# Patient Record
Sex: Male | Born: 1995
Health system: Southern US, Community
[De-identification: ages and names within clinical notes are randomized; demographics above are authoritative.]

## PROBLEM LIST (undated history)

## (undated) DIAGNOSIS — K921 Melena: Secondary | ICD-10-CM

## (undated) HISTORY — DX: Melena: K92.1

## (undated) HISTORY — PX: NO PAST SURGERIES: SHX2092

---
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
## 2018-02-27 ENCOUNTER — Encounter: Payer: Self-pay | Admitting: Family Medicine

## 2018-02-27 ENCOUNTER — Ambulatory Visit: Payer: 59 | Admitting: Family Medicine

## 2018-02-27 VITALS — BP 121/76 | HR 79 | Temp 98.6°F | Resp 20 | Ht 72.0 in | Wt 216.0 lb

## 2018-02-27 DIAGNOSIS — Z7689 Persons encountering health services in other specified circumstances: Secondary | ICD-10-CM | POA: Diagnosis not present

## 2018-02-27 DIAGNOSIS — W57XXXA Bitten or stung by nonvenomous insect and other nonvenomous arthropods, initial encounter: Secondary | ICD-10-CM | POA: Diagnosis not present

## 2018-02-27 DIAGNOSIS — S30861A Insect bite (nonvenomous) of abdominal wall, initial encounter: Secondary | ICD-10-CM | POA: Diagnosis not present

## 2018-02-27 DIAGNOSIS — E663 Overweight: Secondary | ICD-10-CM | POA: Insufficient documentation

## 2018-02-27 MED ORDER — DOXYCYCLINE HYCLATE 100 MG PO TABS: 200.0000 mg | ORAL_TABLET | Freq: Once | ORAL | 0 refills | Status: AC

## 2018-02-27 NOTE — Patient Instructions (Addendum)
Take 2 tabs of doxy today, with some food. This will prevent Lyme disease.  Steroid cream for comfort.  Tick Bite Information, Adult Ticks are insects that can bite. Most ticks live in shrubs and grassy areas. They climb onto people and animals that go by. Then they bite. Some ticks carry germs that can make you sick. How can I prevent tick bites?  Use an insect repellent that has 20% or higher of the ingredients DEET, picaridin, or IR3535. Put this insect repellent on: ? Bare skin. ? The tops of your boots. ? Your pant legs. ? The ends of your sleeves.  If you use an insect repellent that has the ingredient permethrin, make sure to follow the instructions on the bottle. Treat the following: ? Clothing. ? Supplies. ? Boots. ? Tents.  Wear long sleeves, long pants, and light colors.  Tuck your pant legs into your socks.  Stay in the middle of the trail.  Try not to walk through long grass.  Before going inside your house, check your clothes, hair, and skin for ticks. Make sure to check your head, neck, armpits, waist, groin, and joint areas.  Check for ticks every day.  When you come indoors: ? Wash your clothes right away. ? Shower right away. ? Dry your clothes in a dryer on high heat for 60 minutes or more. What is the right way to remove a tick? Remove a tick from your skin as soon as possible.  To remove a tick that is crawling on your skin: ? Go outdoors and brush the tick off. ? Use tape or a lint roller.  To remove a tick that is biting: ? Wash your hands. ? If you have latex gloves, put them on. ? Use tweezers, curved forceps, or a tick-removal tool to grasp the tick. Grasp the tick as close to your skin and as close to the tick's head as possible. ? Gently pull up until the tick lets go.  Try to keep the tick's head attached to its body.  Do not twist or jerk the tick.  Do not squeeze or crush the tick.  Do not try to remove a tick with heat, alcohol,  petroleum jelly, or fingernail polish. How should I get rid of a tick? Here are some ways to get rid of a tick that is alive:  Place the tick in rubbing alcohol.  Place the tick in a bag or container you can close tightly.  Wrap the tick tightly in tape.  Flush the tick down the toilet.  Contact a doctor if:  You have symptoms of a disease, such as: ? Pain in a muscle, joint, or bone. ? Trouble walking or moving your legs. ? Numbness in your legs. ? Inability to move (paralysis). ? A red rash that makes a circle (bull's-eye rash). ? Redness and swelling where the tick bit you. ? A fever. ? Throwing up (vomiting) over and over. ? Diarrhea. ? Weight loss. ? Tender and swollen lymph glands. ? Shortness of breath. ? Cough. ? Belly pain (abdominal pain). ? Headache. ? Being more tired than normal. ? A change in how alert (conscious) you are. ? Confusion. Get help right away if:  You cannot remove a tick.  A part of a tick breaks off and gets stuck in your skin.  You are feeling worse. Summary  Ticks may carry germs that can make you sick.  To prevent tick bites, wear long sleeves, long pants, and light  colors. Use insect repellent. Follow the instructions on the bottle.  If the tick is biting, do not try to remove it with heat, alcohol, petroleum jelly, or fingernail polish.  Use tweezers, curved forceps, or a tick-removal tool to grasp the tick. Gently pull up until the tick lets go. Do not twist or jerk the tick. Do not squeeze or crush the tick.  If you have symptoms, contact a doctor. This information is not intended to replace advice given to you by your health care provider. Make sure you discuss any questions you have with your health care provider. Document Released: 09/06/2009 Document Revised: 09/22/2016 Document Reviewed: 09/22/2016 Elsevier Interactive Patient Education  2018 [employer].

## 2018-02-27 NOTE — Progress Notes (Signed)
Patient ID: Zachary Morrow, male  DOB: 08-08-1995, 22 y.o.   MRN: [id number] Patient Care Team    Relationship Specialty Notifications Start End  Natalia Leatherwood, DO PCP - General Family Medicine  02/27/18     Chief Complaint  Patient presents with  . Establish Care  . Insect Bite    tick bite right forearm 3 days ago    Subjective:  Zachary Morrow is a 22 y.o.  male present for new patient establishment. All past medical history, surgical history, allergies, family history, immunizations, medications and social history were updated in the electronic medical record today. All recent labs, ED visits and hospitalizations within the last year were reviewed.  Tick bite: Pt reports he was exposed/bite by a tick 2-3 days ago. He has applied steroid cream to the area. He states it is mildly itchy. The reaction was significant and he feels he had a "bullseye" rash at location. He reports the steroid cream has helped some. He denies fever, chills, headache, myalgias or rash. He is worried about lyme disease exposure because his friend's mother has lyme disease.   Depression screen PHQ 2/9 02/27/2018  Decreased Interest 0  Down, Depressed, Hopeless 0  PHQ - 2 Score 0   No flowsheet data found.  Current Exercise Habits: The patient does not participate in regular exercise at present Exercise limited by: None identified No flowsheet data found.   There is no immunization history on file for this patient.  No exam data present  Past Medical History:  Diagnosis Date  . Blood in stool 2016   history   No Known Allergies Past Surgical History:  Procedure Laterality Date  . NO PAST SURGERIES     Family History  Problem Relation Age of Onset  . Breast cancer Mother   . Miscarriages / [place] Mother   . Heart disease Father   . Hyperlipidemia Father   . Heart disease Maternal Grandmother   . Hyperlipidemia Maternal Grandmother   . Hypertension Maternal Grandmother   .  Stroke Maternal Grandmother   . Heart disease Maternal Grandfather   . Hyperlipidemia Maternal Grandfather   . Hypertension Maternal Grandfather   . Diabetes Maternal Grandfather   . Hyperlipidemia Paternal Grandmother   . Hypertension Paternal Grandmother   . Hearing loss Paternal Grandmother   . Heart disease Paternal Grandmother   . Cancer Paternal Grandmother    Social History   Socioeconomic History  . Marital status: Single    Spouse name: Not on file  . Number of children: Not on file  . Years of education: Not on file  . Highest education level: Not on file  Occupational History  . Not on file  Social Needs  . Financial resource strain: Not on file  . Food insecurity:    Worry: Not on file    Inability: Not on file  . Transportation needs:    Medical: Not on file    Non-medical: Not on file  Tobacco Use  . Smoking status: Never Smoker  . Smokeless tobacco: Never Used  Substance and Sexual Activity  . Alcohol use: Yes  . Drug use: Never  . Sexual activity: Yes    Partners: Female  Lifestyle  . Physical activity:    Days per week: Not on file    Minutes per session: Not on file  . Stress: Not on file  Relationships  . Social connections:    Talks on phone: Not on file  Gets together: Not on file    Attends religious service: Not on file    Active member of club or organization: Not on file    Attends meetings of clubs or organizations: Not on file    Relationship status: Not on file  . Intimate partner violence:    Fear of current or ex partner: Not on file    Emotionally abused: Not on file    Physically abused: Not on file    Forced sexual activity: Not on file  Other Topics Concern  . Not on file  Social History Narrative   Marital status/children/pets: Single   Education/employment: HS diploma   Safety:      -smoke alarm in the home:Yes     - wears seatbelt: Yes      Allergies as of 02/27/2018   No Known Allergies     Medication List          Accurate as of 02/27/18  1:54 PM. Always use your most recent med list.          doxycycline 100 MG tablet Commonly known as:  VIBRA-TABS Take 2 tablets (200 mg total) by mouth once for 1 dose.       All past medical history, surgical history, allergies, family history, immunizations andmedications were updated in the EMR today and reviewed under the history and medication portions of their EMR.    No results found for this or any previous visit (from the past 2160 hour(s)).  ROS: 14 pt review of systems performed and negative (unless mentioned in an HPI)  Objective: BP 121/76 (BP Location: Left Arm, Patient Position: Sitting, Cuff Size: Large)   Pulse 79   Temp 98.6 F (37 C)   Resp 20   Ht 6' (1.829 m)   Wt 216 lb (98 kg)   SpO2 98%   BMI 29.29 kg/m  Gen: Afebrile. No acute distress. Nontoxic in appearance, well-developed, well-nourished,  Overweight, caucasian male.  HENT: AT. Normal. Bilateral TM visualized and normal in appearance, normal external auditory canal. MMM, no oral lesions, adequate dentition. Eyes:Pupils Equal Round Reactive to light, Extraocular movements intact,  Conjunctiva without redness, discharge or icterus. CV: RRR Abd: Soft. NTND. BS present Skin: No rashes, purpura or petechiae. Erythema/local reaction to tick bite.  Neuro/Msk: Normal gait. PERLA. EOMi. Alert. Oriented x3.    Assessment/plan: Zachary Morrow is a 22 y.o. male present for  Tick bite of abdomen, initial encounter - discussed tick prevention and testing for lyme etc - Doxy 200 mg once prescribed, within window. Monitor for signs/sympotms of lyme. Return if they develop/  - F/U PRN  Encourage him to schedule CPE ( at least every other year)  Greater than 20 minutes spent with patient, >50% of time spent face to face counseling    Note is dictated utilizing voice recognition software. Although note has been proof read prior to signing, occasional typographical errors still can be  missed. If any questions arise, please do not hesitate to call for verification.  Electronically signed by: Felix Pacini, DO [facility]

## 2018-02-28 ENCOUNTER — Telehealth: Payer: Self-pay | Admitting: Family Medicine

## 2018-02-28 NOTE — Telephone Encounter (Signed)
Copied from CRM [id number]. Topic: Quick Communication - See Telephone Encounter >> Feb 28, 2018 12:10 PM [name] wrote: CRM for notification. See Telephone encounter for: 02/28/18.  Patient said the Doxycycline is making him sick on his stomach. He took them yesterday when he got home and he got sick about 45 mins later.

## 2018-02-28 NOTE — Telephone Encounter (Signed)
Tried to contact patient unable to leave a message. 

## 2018-03-01 NOTE — Telephone Encounter (Signed)
Returning call, CB [phone]

## 2018-03-01 NOTE — Telephone Encounter (Signed)
Spoke with patient he just had one dose to take he was worried since he vomited 45 minutes later he might not gotten medication in his system. He has not had any further symptoms related to the tick bite. Instructed patient to follow up as needed.

## 2018-07-25 ENCOUNTER — Other Ambulatory Visit: Payer: Self-pay | Admitting: Family Medicine

## 2018-07-25 ENCOUNTER — Ambulatory Visit
Admission: RE | Admit: 2018-07-25 | Discharge: 2018-07-25 | Disposition: A | Discharge status: Home / Self Care | Payer: No Typology Code available for payment source | Source: Ambulatory Visit | Attending: Family Medicine | Admitting: Family Medicine

## 2018-07-25 DIAGNOSIS — M542 Cervicalgia: Secondary | ICD-10-CM

## 2018-10-08 ENCOUNTER — Ambulatory Visit (INDEPENDENT_AMBULATORY_CARE_PROVIDER_SITE_OTHER): Payer: 59 | Admitting: Family Medicine

## 2018-10-08 ENCOUNTER — Encounter: Payer: Self-pay | Admitting: Family Medicine

## 2018-10-08 ENCOUNTER — Other Ambulatory Visit: Payer: Self-pay

## 2018-10-08 DIAGNOSIS — R319 Hematuria, unspecified: Secondary | ICD-10-CM

## 2018-10-08 DIAGNOSIS — R109 Unspecified abdominal pain: Secondary | ICD-10-CM

## 2018-10-08 DIAGNOSIS — N410 Acute prostatitis: Secondary | ICD-10-CM

## 2018-10-08 MED ORDER — SULFAMETHOXAZOLE-TRIMETHOPRIM 800-160 MG PO TABS: 1.0000 | ORAL_TABLET | Freq: Two times a day (BID) | ORAL | 0 refills | Status: AC

## 2018-10-08 NOTE — Progress Notes (Signed)
Virtual Visit via Video Note  I connected with pt  on 10/08/18 at 11:30 AM EDT by a video enabled telemedicine application and verified that I am speaking with the correct person using two identifiers.  Location patient: home Location provider:work office. Persons participating in the virtual visit: patient, myself.  I discussed the limitations of evaluation and management by telemedicine and the availability of in person appointments. The patient expressed understanding and agreed to proceed.  Telemedicine visit is a necessity given the COVID-19 restrictions in place at the current time.  HPI:  23 y/o WM :  1-2 week history of bilat flank pain, constant, intensity moderate at rest, then goes to severe when moving around.   No physical strain but he does work as a Engineer, site.  No heavy lifting. Urine has dark, orange appearance the majority of the time the last 1-2 wks.  He does admit that he doesn't hydrate well, but the last week or so he has been trying extra hard to hydrate and it hasn't helped. L testicle has hurt on and off during the last 1-2 wks.  Some vague pain/discomfort at times in the area of the prostate gland. No lower abd pain and no radiating groin pain. No fevers.  No n/v. No known hx of kidney stone.   Has burned when urinating a couple times but nothing persistent.  Urgency and frequency "sort of". Denies hx of STI, no new sex partner, no unprotected sex. He is circumcised.  Has never had a bladder infection. He has not taken any med for his pain.   ROS: no CP, no SOB, no wheezing, no cough, no dizziness, no HAs, no rashes, no melena/hematochezia.  No polyuria or polydipsia.  No myalgias or arthralgias.   Past Medical History:  Diagnosis Date  . Blood in stool 2016   history    Past Surgical History:  Procedure Laterality Date  . NO PAST SURGERIES      Family History  Problem Relation Age of Onset  . Breast cancer Mother   . Miscarriages /  [place] Mother   . Heart disease Father   . Hyperlipidemia Father   . Heart disease Maternal Grandmother   . Hyperlipidemia Maternal Grandmother   . Hypertension Maternal Grandmother   . Stroke Maternal Grandmother   . Heart disease Maternal Grandfather   . Hyperlipidemia Maternal Grandfather   . Hypertension Maternal Grandfather   . Diabetes Maternal Grandfather   . Hyperlipidemia Paternal Grandmother   . Hypertension Paternal Grandmother   . Hearing loss Paternal Grandmother   . Heart disease Paternal Grandmother   . Cancer Paternal Grandmother      No current outpatient medications on file.NONE  EXAM:  VITALS per patient if applicable:There were no vitals taken for this visit.   GENERAL: alert, oriented, appears well and in no acute distress  HEENT: atraumatic, conjunttiva clear, no obvious abnormalities on inspection of external nose and ears  NECK: normal movements of the head and neck  LUNGS: on inspection no signs of respiratory distress, breathing rate appears normal, no obvious gross SOB, gasping or wheezing  CV: no obvious cyanosis  MS: moves all visible extremities without noticeable abnormality  PSYCH/NEURO: pleasant and cooperative, no obvious depression or anxiety, speech and thought processing grossly intact  LABS: none today   ASSESSMENT AND PLAN:  Discussed the following assessment and plan:  Subacute bilat flank pain with possible gross hematuria. Doesn't fit with stone dz or pyelo. Question acute prostatitis. He definitely  looks well talking to him and looking at him on video today--definitely not acutely ill or in any distress. Will have him come in tomorrow for UA with reflex microscopy, urine c/s, CBC w/diff, and BMET.  Given pt's age, I'll also check urine GC/Chlamydia.  I will rx bactrim DS 1 bid x 14d but I made it clear that I want him to wait to take the first dose until AFTER he has given his urine specimen tomorrow. In the  meantime, I encouraged him to take ibup 600 mg bid with food, continue aggressive hydration.    I discussed the assessment and treatment plan with the patient. The patient was provided an opportunity to ask questions and all were answered. The patient agreed with the plan and demonstrated an understanding of the instructions.   The patient was advised to call back or seek an in-person evaluation if the symptoms worsen or if the condition fails to improve as anticipated.  F/u: 1 wk virtual visit Labs: tomorrow  Jeoffrey MassedPhilip H Maridee Slape, MD

## 2018-10-09 ENCOUNTER — Other Ambulatory Visit (HOSPITAL_COMMUNITY)
Admission: RE | Admit: 2018-10-09 | Discharge: 2018-10-09 | Disposition: A | Discharge status: Home / Self Care | Payer: 59 | Source: Ambulatory Visit | Attending: Family Medicine | Admitting: Family Medicine

## 2018-10-09 ENCOUNTER — Other Ambulatory Visit (INDEPENDENT_AMBULATORY_CARE_PROVIDER_SITE_OTHER): Payer: 59

## 2018-10-09 DIAGNOSIS — N410 Acute prostatitis: Secondary | ICD-10-CM | POA: Diagnosis present

## 2018-10-09 DIAGNOSIS — R109 Unspecified abdominal pain: Secondary | ICD-10-CM

## 2018-10-09 DIAGNOSIS — R319 Hematuria, unspecified: Secondary | ICD-10-CM

## 2018-10-09 LAB — URINALYSIS, ROUTINE W REFLEX MICROSCOPIC
Bilirubin Urine: NEGATIVE
Hgb urine dipstick: NEGATIVE
Ketones, ur: NEGATIVE
Leukocytes,Ua: NEGATIVE
Nitrite: NEGATIVE
RBC / HPF: NONE SEEN (ref 0–?)
Specific Gravity, Urine: 1.02 (ref 1.000–1.030)
Total Protein, Urine: NEGATIVE
Urine Glucose: NEGATIVE
Urobilinogen, UA: 0.2 (ref 0.0–1.0)
pH: 6.5 (ref 5.0–8.0)

## 2018-10-09 LAB — CBC WITH DIFFERENTIAL/PLATELET
Basophils Absolute: 0.1 10*3/uL (ref 0.0–0.1)
Basophils Relative: 1.2 % (ref 0.0–3.0)
Eosinophils Absolute: 0.1 10*3/uL (ref 0.0–0.7)
Eosinophils Relative: 1.7 % (ref 0.0–5.0)
HCT: 47.7 % (ref 39.0–52.0)
Hemoglobin: 16.8 g/dL (ref 13.0–17.0)
Lymphocytes Relative: 35 % (ref 12.0–46.0)
Lymphs Abs: 1.7 10*3/uL (ref 0.7–4.0)
MCHC: 35.3 g/dL (ref 30.0–36.0)
MCV: 90 fl (ref 78.0–100.0)
Monocytes Absolute: 0.3 10*3/uL (ref 0.1–1.0)
Monocytes Relative: 7.1 % (ref 3.0–12.0)
Neutro Abs: 2.7 10*3/uL (ref 1.4–7.7)
Neutrophils Relative %: 55 % (ref 43.0–77.0)
Platelets: 245 10*3/uL (ref 150.0–400.0)
RBC: 5.3 Mil/uL (ref 4.22–5.81)
RDW: 12.7 % (ref 11.5–15.5)
WBC: 4.9 10*3/uL (ref 4.0–10.5)

## 2018-10-09 LAB — BASIC METABOLIC PANEL
BUN: 10 mg/dL (ref 6–23)
CO2: 30 mEq/L (ref 19–32)
Calcium: 9.8 mg/dL (ref 8.4–10.5)
Chloride: 102 mEq/L (ref 96–112)
Creatinine, Ser: 0.84 mg/dL (ref 0.40–1.50)
GFR: 115.07 mL/min (ref >60.00)
Glucose, Bld: 87 mg/dL (ref 70–99)
Potassium: 4.7 mEq/L (ref 3.5–5.1)
Sodium: 140 mEq/L (ref 135–145)

## 2018-10-10 LAB — URINE CYTOLOGY ANCILLARY ONLY
Chlamydia: NEGATIVE
Neisseria Gonorrhea: NEGATIVE

## 2018-10-10 LAB — URINE CULTURE
MICRO NUMBER:: 397698
Result:: NO GROWTH
SPECIMEN QUALITY:: ADEQUATE

## 2018-10-16 ENCOUNTER — Other Ambulatory Visit: Payer: Self-pay

## 2018-10-16 ENCOUNTER — Ambulatory Visit: Payer: 59 | Admitting: Family Medicine

## 2018-10-24 ENCOUNTER — Ambulatory Visit (INDEPENDENT_AMBULATORY_CARE_PROVIDER_SITE_OTHER): Payer: 59 | Admitting: Family Medicine

## 2018-10-24 ENCOUNTER — Other Ambulatory Visit: Payer: Self-pay

## 2018-10-24 ENCOUNTER — Encounter: Payer: Self-pay | Admitting: Family Medicine

## 2018-10-24 DIAGNOSIS — R293 Abnormal posture: Secondary | ICD-10-CM

## 2018-10-24 DIAGNOSIS — N410 Acute prostatitis: Secondary | ICD-10-CM | POA: Diagnosis not present

## 2018-10-24 DIAGNOSIS — M549 Dorsalgia, unspecified: Secondary | ICD-10-CM | POA: Diagnosis not present

## 2018-10-24 NOTE — Progress Notes (Signed)
Virtual Visit via Video Note  I connected with pt on 10/24/18 at 10:40 AM EDT by a video enabled telemedicine application and verified that I am speaking with the correct person using two identifiers.  Location patient: home Location provider:work or home office Persons participating in the virtual visit: patient, provider  I discussed the limitations of evaluation and management by telemedicine and the availability of in person appointments. The patient expressed understanding and agreed to proceed.  Telemedicine visit is a necessity given the COVID-19 restrictions in place at the current time.  HPI: 23 y/o WM being seen for 2 wk f/u bilat flank pain. The etiology of this has not been clear.  I tried empiric abx for possible acute prostatitis since he seemed to have a couple of sx's that were suggestive of possiblity of this. Blood and urine testing last visit were all normal.  Interim hx: He says his back is about the same, maybe a little better, but all urinary sx's have resolved. He took all the antibiotic as rx'd. He describes the pain in back as being on both sides, approaching flanks.  No midline back pain.  He notices his pain being present more when sitting slumped over while doing work or while sitting in car driving, etc.  Turning or bending motions of his torso do not worsen his back pain.  No pain across abd or down into groin.  No pain into neck or down legs. No paresthesias.  No urinary frequency, urgency, or dysuria.  No urinary hesitancy or incomplete emptying.    ROS: See pertinent positives and negatives per HPI.  Past Medical History:  Diagnosis Date  . Blood in stool 2016   history    Past Surgical History:  Procedure Laterality Date  . NO PAST SURGERIES      Family History  Problem Relation Age of Onset  . Breast cancer Mother   . Miscarriages / [place] Mother   . Heart disease Father   . Hyperlipidemia Father   . Heart disease Maternal Grandmother    . Hyperlipidemia Maternal Grandmother   . Hypertension Maternal Grandmother   . Stroke Maternal Grandmother   . Heart disease Maternal Grandfather   . Hyperlipidemia Maternal Grandfather   . Hypertension Maternal Grandfather   . Diabetes Maternal Grandfather   . Hyperlipidemia Paternal Grandmother   . Hypertension Paternal Grandmother   . Hearing loss Paternal Grandmother   . Heart disease Paternal Grandmother   . Cancer Paternal Grandmother      No current outpatient medications on file.  EXAM:  VITALS per patient if applicable: There were no vitals taken for this visit.   GENERAL: alert, oriented, appears well and in no acute distress  HEENT: atraumatic, conjunttiva clear, no obvious abnormalities on inspection of external nose and ears  NECK: normal movements of the head and neck  LUNGS: on inspection no signs of respiratory distress, breathing rate appears normal, no obvious gross SOB, gasping or wheezing  CV: no obvious cyanosis  MS: moves all visible extremities without noticeable abnormality  PSYCH/NEURO: pleasant and cooperative, no obvious depression or anxiety, speech and thought processing grossly intact  LABS: none today    Chemistry      Component Value Date/Time   NA 140 10/09/2018 1005   K 4.7 10/09/2018 1005   CL 102 10/09/2018 1005   CO2 30 10/09/2018 1005   BUN 10 10/09/2018 1005   CREATININE 0.84 10/09/2018 1005      Component Value Date/Time  CALCIUM 9.8 10/09/2018 1005     Lab Results  Component Value Date   WBC 4.9 10/09/2018   HGB 16.8 10/09/2018   HCT 47.7 10/09/2018   MCV 90.0 10/09/2018   PLT 245.0 10/09/2018   UA 10/09/18 all normal. Urine clx 10/09/18 NO GROWTH  ASSESSMENT AND PLAN:  Discussed the following assessment and plan:  1) Musculoskeletal mid back pain, suspect this is mainly posture-related. Discussed ROM, attention to appropriate back and neck posture, recommended yoga on regular basis to help with  this. Question of prostatitis contributing to sx's recently: not sure about this, but all suspicion of urinary sx's seem to be resolved s/p abx.  Signs/symptoms to call or return for were reviewed and pt expressed understanding.  I discussed the assessment and treatment plan with the patient. The patient was provided an opportunity to ask questions and all were answered. The patient agreed with the plan and demonstrated an understanding of the instructions.   The patient was advised to call back or seek an in-person evaluation if the symptoms worsen or if the condition fails to improve as anticipated.  F/u:  Prn. 1-2 yr CPE  Signed:  Santiago BumpersPhil Sascha Palma, MD           10/24/2018

## 2019-10-02 ENCOUNTER — Ambulatory Visit: Payer: Self-pay | Attending: Internal Medicine

## 2019-10-02 DIAGNOSIS — Z23 Encounter for immunization: Secondary | ICD-10-CM

## 2019-10-02 NOTE — Progress Notes (Signed)
   Covid-19 Vaccination Clinic  Name:  SANTOSH PETTER    MRN: [id number] DOB: Dec 29, 1995  10/02/2019  Mr. Mccluney was observed post Covid-19 immunization for 15 minutes without incident. He was provided with Vaccine Information Sheet and instruction to access the V-Safe system.   Mr. Grayson was instructed to call 911 with any severe reactions post vaccine: Marland Kitchen Difficulty breathing  . Swelling of face and throat  . A fast heartbeat  . A bad rash all over body  . Dizziness and weakness   Immunizations Administered    Name Date Dose VIS Date Route   Pfizer COVID-19 Vaccine 10/02/2019  8:20 AM 0.3 mL 06/06/2019 Intramuscular   Manufacturer: [employer], [employer]   Lot: [id number]   NDC: [address]-5

## 2019-10-27 ENCOUNTER — Ambulatory Visit: Payer: Self-pay | Attending: Internal Medicine

## 2019-10-27 DIAGNOSIS — Z23 Encounter for immunization: Secondary | ICD-10-CM

## 2019-10-27 NOTE — Progress Notes (Signed)
   Covid-19 Vaccination Clinic  Name:  Zachary Morrow    MRN: [id number] DOB: 11-30-1995  10/27/2019  Zachary Morrow was observed post Covid-19 immunization for 15 minutes without incident. He was provided with Vaccine Information Sheet and instruction to access the V-Safe system.   Zachary Morrow was instructed to call 911 with any severe reactions post vaccine: Marland Kitchen Difficulty breathing  . Swelling of face and throat  . A fast heartbeat  . A bad rash all over body  . Dizziness and weakness   Immunizations Administered    Name Date Dose VIS Date Route   Pfizer COVID-19 Vaccine 10/27/2019  8:20 AM 0.3 mL 08/20/2018 Intramuscular   Manufacturer: [employer], [employer]   Lot: [id number]   NDC: [address]-1
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
## 2020-09-29 ENCOUNTER — Ambulatory Visit: Payer: 59 | Admitting: Family Medicine

## 2020-09-29 ENCOUNTER — Other Ambulatory Visit: Payer: Self-pay

## 2020-09-29 ENCOUNTER — Encounter: Payer: Self-pay | Admitting: Family Medicine

## 2020-09-29 VITALS — BP 111/73 | HR 85 | Temp 98.9°F | Ht 72.0 in | Wt 175.0 lb

## 2020-09-29 DIAGNOSIS — L42 Pityriasis rosea: Secondary | ICD-10-CM | POA: Diagnosis not present

## 2020-09-29 NOTE — Patient Instructions (Signed)
This should go away on its own in a few months.

## 2020-09-29 NOTE — Progress Notes (Signed)
This visit occurred during the SARS-CoV-2 public health emergency.  Safety protocols were in place, including screening questions prior to the visit, additional usage of staff PPE, and extensive cleaning of exam room while observing appropriate contact time as indicated for disinfecting solutions.    Zachary Morrow , 04/04/1996, 25 y.o., male MRN: [id number] Patient Care Team    Relationship Specialty Notifications Start End  Natalia Leatherwood, DO PCP - General Family Medicine  02/27/18     Chief Complaint  Patient presents with  . Rash    Pt c/o rash on back x 2 weeks; pt reports mild itching below rash;      Subjective: Pt presents for an OV with complaints of rash that has been present for a few weeks. He reports it sometimes itches, but nothing bad. He has not tried anything for the rash. He has never had a rash like this in the past. He denies any changes to diet, meds, OTc or personal care products prior to onset.   Depression screen [facility] 2/9 09/29/2020 02/27/2018  Decreased Interest 0 0  Down, Depressed, Hopeless 0 0  PHQ - 2 Score 0 0    No Known Allergies Social History   Social History Narrative   Marital status/children/pets: Single   Education/employment: HS diploma   Safety:      -smoke alarm in the home:Yes     - wears seatbelt: Yes      Past Medical History:  Diagnosis Date  . Blood in stool 2016   history   Past Surgical History:  Procedure Laterality Date  . NO PAST SURGERIES     Family History  Problem Relation Age of Onset  . Breast cancer Mother   . Miscarriages / [place] Mother   . Heart disease Father   . Hyperlipidemia Father   . Heart disease Maternal Grandmother   . Hyperlipidemia Maternal Grandmother   . Hypertension Maternal Grandmother   . Stroke Maternal Grandmother   . Heart disease Maternal Grandfather   . Hyperlipidemia Maternal Grandfather   . Hypertension Maternal Grandfather   . Diabetes Maternal Grandfather   .  Hyperlipidemia Paternal Grandmother   . Hypertension Paternal Grandmother   . Hearing loss Paternal Grandmother   . Heart disease Paternal Grandmother   . Cancer Paternal Grandmother    Allergies as of 09/29/2020   No Known Allergies     Medication List    as of September 29, 2020  3:32 PM   You have not been prescribed any medications.     All past medical history, surgical history, allergies, family history, immunizations andmedications were updated in the EMR today and reviewed under the history and medication portions of their EMR.     ROS: Negative, with the exception of above mentioned in HPI   Objective:  BP 111/73   Pulse 85   Temp 98.9 F (37.2 C) (Oral)   Ht 6' (1.829 m)   Wt 175 lb (79.4 kg)   SpO2 96%   BMI 23.73 kg/m  Body mass index is 23.73 kg/m. Gen: Afebrile. No acute distress. Nontoxic in appearance, well developed, well nourished.  HENT: AT. [place].  Eyes:Pupils Equal Round Reactive to light, Extraocular movements intact,  Conjunctiva without redness, discharge or icterus. Neck/lymp/endocrine: Supple,no lymphadenopathy Skin: no purpura or petechiae. falt macular rash over back and small amount of chest.  Neuro: Normal gait. PERLA. EOMi. Alert. Oriented x3  Psych: Normal affect, dress and demeanor. Normal speech. Normal  thought content and judgment.  No exam data present No results found. No results found for this or any previous visit (from the past 24 hour(s)).  Assessment/Plan: Zachary Morrow is a 25 y.o. male present for OV for  Pityriasis rosea Rash consistent with pityriasis rosea.  reassurance and patient education provided.      Reviewed expectations re: course of current medical issues.  Discussed self-management of symptoms.  Outlined signs and symptoms indicating need for more acute intervention.  Patient verbalized understanding and all questions were answered.  Patient received an After-Visit Summary.    No orders of the defined  types were placed in this encounter.  No orders of the defined types were placed in this encounter.  Referral Orders  No referral(s) requested today     Note is dictated utilizing voice recognition software. Although note has been proof read prior to signing, occasional typographical errors still can be missed. If any questions arise, please do not hesitate to call for verification.   electronically signed by:  Felix Pacini, DO  [facility] [place]

## 2020-12-08 ENCOUNTER — Encounter: Payer: Self-pay | Admitting: Family Medicine

## 2020-12-08 ENCOUNTER — Ambulatory Visit (INDEPENDENT_AMBULATORY_CARE_PROVIDER_SITE_OTHER): Payer: 59 | Admitting: Family Medicine

## 2020-12-08 ENCOUNTER — Other Ambulatory Visit: Payer: Self-pay

## 2020-12-08 VITALS — BP 104/70 | HR 83 | Temp 98.4°F | Ht 70.75 in | Wt 172.0 lb

## 2020-12-08 DIAGNOSIS — Z113 Encounter for screening for infections with a predominantly sexual mode of transmission: Secondary | ICD-10-CM

## 2020-12-08 DIAGNOSIS — Z131 Encounter for screening for diabetes mellitus: Secondary | ICD-10-CM | POA: Diagnosis not present

## 2020-12-08 DIAGNOSIS — Z13220 Encounter for screening for lipoid disorders: Secondary | ICD-10-CM

## 2020-12-08 DIAGNOSIS — Z23 Encounter for immunization: Secondary | ICD-10-CM | POA: Diagnosis not present

## 2020-12-08 DIAGNOSIS — Z0000 Encounter for general adult medical examination without abnormal findings: Secondary | ICD-10-CM | POA: Diagnosis not present

## 2020-12-08 DIAGNOSIS — Z130 Encounter for screening for diseases of the blood and blood-forming organs and certain disorders involving the immune mechanism: Secondary | ICD-10-CM | POA: Diagnosis not present

## 2020-12-08 DIAGNOSIS — Z8249 Family history of ischemic heart disease and other diseases of the circulatory system: Secondary | ICD-10-CM | POA: Diagnosis not present

## 2020-12-08 DIAGNOSIS — R21 Rash and other nonspecific skin eruption: Secondary | ICD-10-CM

## 2020-12-08 LAB — COMPREHENSIVE METABOLIC PANEL
ALT: 16 U/L (ref 0–53)
AST: 16 U/L (ref 0–37)
Albumin: 4.9 g/dL (ref 3.5–5.2)
Alkaline Phosphatase: 46 U/L (ref 39–117)
BUN: 9 mg/dL (ref 6–23)
CO2: 29 mEq/L (ref 19–32)
Calcium: 9.7 mg/dL (ref 8.4–10.5)
Chloride: 101 mEq/L (ref 96–112)
Creatinine, Ser: 0.96 mg/dL (ref 0.40–1.50)
GFR: 111.49 mL/min (ref >60.00)
Glucose, Bld: 78 mg/dL (ref 70–99)
Potassium: 4.6 mEq/L (ref 3.5–5.1)
Sodium: 139 mEq/L (ref 135–145)
Total Bilirubin: 0.9 mg/dL (ref 0.2–1.2)
Total Protein: 7 g/dL (ref 6.0–8.3)

## 2020-12-08 LAB — CBC WITH DIFFERENTIAL/PLATELET
Basophils Absolute: 0 10*3/uL (ref 0.0–0.1)
Basophils Relative: 1 % (ref 0.0–3.0)
Eosinophils Absolute: 0.1 10*3/uL (ref 0.0–0.7)
Eosinophils Relative: 2.1 % (ref 0.0–5.0)
HCT: 47.1 % (ref 39.0–52.0)
Hemoglobin: 16 g/dL (ref 13.0–17.0)
Lymphocytes Relative: 39.7 % (ref 12.0–46.0)
Lymphs Abs: 1.6 10*3/uL (ref 0.7–4.0)
MCHC: 33.9 g/dL (ref 30.0–36.0)
MCV: 91.9 fl (ref 78.0–100.0)
Monocytes Absolute: 0.4 10*3/uL (ref 0.1–1.0)
Monocytes Relative: 9 % (ref 3.0–12.0)
Neutro Abs: 1.9 10*3/uL (ref 1.4–7.7)
Neutrophils Relative %: 48.2 % (ref 43.0–77.0)
Platelets: 275 10*3/uL (ref 150.0–400.0)
RBC: 5.13 Mil/uL (ref 4.22–5.81)
RDW: 13.3 % (ref 11.5–15.5)
WBC: 4 10*3/uL (ref 4.0–10.5)

## 2020-12-08 LAB — LIPID PANEL
Cholesterol: 177 mg/dL (ref 0–200)
HDL: 49.2 mg/dL (ref >39.00)
LDL Cholesterol: 111 mg/dL — ABNORMAL HIGH (ref 0–99)
NonHDL: 127.39
Total CHOL/HDL Ratio: 4
Triglycerides: 81 mg/dL (ref 0.0–149.0)
VLDL: 16.2 mg/dL (ref 0.0–40.0)

## 2020-12-08 LAB — HEMOGLOBIN A1C: Hgb A1c MFr Bld: 5.2 % (ref 4.6–6.5)

## 2020-12-08 NOTE — Patient Instructions (Signed)
Health Maintenance, Male Adopting a healthy lifestyle and getting preventive care are important in promoting health and wellness. Ask your health care provider about: The right schedule for you to have regular tests and exams. Things you can do on your own to prevent diseases and keep yourself healthy. What should I know about diet, weight, and exercise? Eat a healthy diet  Eat a diet that includes plenty of vegetables, fruits, low-fat dairy products, and lean protein. Do not eat a lot of foods that are high in solid fats, added sugars, or sodium.  Maintain a healthy weight Body mass index (BMI) is a measurement that can be used to identify possible weight problems. It estimates body fat based on height and weight. Your health care provider can help determine your BMI and help you achieve or maintain ahealthy weight. Get regular exercise Get regular exercise. This is one of the most important things you can do for your health. Most adults should: Exercise for at least 150 minutes each week. The exercise should increase your heart rate and make you sweat (moderate-intensity exercise). Do strengthening exercises at least twice a week. This is in addition to the moderate-intensity exercise. Spend less time sitting. Even light physical activity can be beneficial. Watch cholesterol and blood lipids Have your blood tested for lipids and cholesterol at 23 years of age, then havethis test every 5 years. You may need to have your cholesterol levels checked more often if: Your lipid or cholesterol levels are high. You are older than 23 years of age. You are at high risk for heart disease. What should I know about cancer screening? Many types of cancers can be detected early and may often be prevented. Depending on your health history and family history, you may need to have cancer screening at various ages. This may include screening for: Colorectal cancer. Prostate cancer. Skin cancer. Lung  cancer. What should I know about heart disease, diabetes, and high blood pressure? Blood pressure and heart disease High blood pressure causes heart disease and increases the risk of stroke. This is more likely to develop in people who have high blood pressure readings, are of African descent, or are overweight. Talk with your health care provider about your target blood pressure readings. Have your blood pressure checked: Every 3-5 years if you are 18-39 years of age. Every year if you are 40 years old or older. If you are between the ages of 65 and 75 and are a current or former smoker, ask your health care provider if you should have a one-time screening for abdominal aortic aneurysm (AAA). Diabetes Have regular diabetes screenings. This checks your fasting blood sugar level. Have the screening done: Once every three years after age 45 if you are at a normal weight and have a low risk for diabetes. More often and at a younger age if you are overweight or have a high risk for diabetes. What should I know about preventing infection? Hepatitis B If you have a higher risk for hepatitis B, you should be screened for this virus. Talk with your health care provider to find out if you are at risk forhepatitis B infection. Hepatitis C Blood testing is recommended for: Everyone born from 1945 through 1965. Anyone with known risk factors for hepatitis C. Sexually transmitted infections (STIs) You should be screened each year for STIs, including gonorrhea and chlamydia, if: You are sexually active and are younger than 24 years of age. You are older than 24 years of age   and your health care provider tells you that you are at risk for this type of infection. Your sexual activity has changed since you were last screened, and you are at increased risk for chlamydia or gonorrhea. Ask your health care provider if you are at risk. Ask your health care provider about whether you are at high risk for HIV.  Your health care provider may recommend a prescription medicine to help prevent HIV infection. If you choose to take medicine to prevent HIV, you should first get tested for HIV. You should then be tested every 3 months for as long as you are taking the medicine. Follow these instructions at home: Lifestyle Do not use any products that contain nicotine or tobacco, such as cigarettes, e-cigarettes, and chewing tobacco. If you need help quitting, ask your health care provider. Do not use street drugs. Do not share needles. Ask your health care provider for help if you need support or information about quitting drugs. Alcohol use Do not drink alcohol if your health care provider tells you not to drink. If you drink alcohol: Limit how much you have to 0-2 drinks a day. Be aware of how much alcohol is in your drink. In the [place] one drink equals one 12 oz bottle of beer (355 mL), one 5 oz glass of wine (148 mL), or one 1 oz glass of hard liquor (44 mL). General instructions Schedule regular health, dental, and eye exams. Stay current with your vaccines. Tell your health care provider if: You often feel depressed. You have ever been abused or do not feel safe at home. Summary Adopting a healthy lifestyle and getting preventive care are important in promoting health and wellness. Follow your health care provider's instructions about healthy diet, exercising, and getting tested or screened for diseases. Follow your health care provider's instructions on monitoring your cholesterol and blood pressure. This information is not intended to replace advice given to you by your health care provider. Make sure you discuss any questions you have with your healthcare provider. Document Revised: 06/05/2018 Document Reviewed: 06/05/2018 Elsevier Patient Education  2022 Elsevier Inc.  

## 2020-12-08 NOTE — Progress Notes (Signed)
This visit occurred during the SARS-CoV-2 public health emergency.  Safety protocols were in place, including screening questions prior to the visit, additional usage of staff PPE, and extensive cleaning of exam room while observing appropriate contact time as indicated for disinfecting solutions.    Patient ID: Zachary Morrow, male  DOB: May 31, 1996, 25 y.o.   MRN: [id number] Patient Care Team    Relationship Specialty Notifications Start End  Natalia Leatherwood, DO PCP - General Family Medicine  02/27/18     Chief Complaint  Patient presents with   Annual Exam    Pt is fasting     Subjective:  Zachary Morrow is a 25 y.o. male present for CPE. All past medical history, surgical history, allergies, family history, immunizations, medications and social history were updated in the electronic medical record today. All recent labs, ED visits and hospitalizations within the last year were reviewed.  Health maintenance:  Colonoscopy: No fhx- routine screen at 47 Immunizations:  tdap administered today, influenza encouraged yearly, HPV series completed, covid seriesx2 Infectious disease screening: HIV , Hep C . G/C > all collected today.  PSA: No results found for: PSA, pt was counseled on prostate cancer screenings. No fhx. Routine screen.  Assistive device: none Oxygen AST:MHDQ Patient has a Dental home. Hospitalizations/ED visits: reviewed  Depression screen [facility] 2/9 12/08/2020 09/29/2020 02/27/2018  Decreased Interest 0 0 0  Down, Depressed, Hopeless 0 0 0  PHQ - 2 Score 0 0 0   No flowsheet data found.      No flowsheet data found.   Immunization History  Administered Date(s) Administered   HPV 9-valent 11/01/2012, 02/12/2014   PFIZER(Purple Top)SARS-COV-2 Vaccination 10/02/2019, 10/27/2019   Tdap 12/08/2020     Past Medical History:  Diagnosis Date   Blood in stool 2016   history   No Known Allergies Past Surgical History:  Procedure Laterality Date   NO PAST SURGERIES      Family History  Problem Relation Age of Onset   Breast cancer Mother    Miscarriages / Stillbirths Mother    Heart disease Father    Hyperlipidemia Father    Heart disease Maternal Grandmother    Hyperlipidemia Maternal Grandmother    Hypertension Maternal Grandmother    Stroke Maternal Grandmother    Heart disease Maternal Grandfather    Hyperlipidemia Maternal Grandfather    Hypertension Maternal Grandfather    Diabetes Maternal Grandfather    Hyperlipidemia Paternal Grandmother    Hypertension Paternal Grandmother    Hearing loss Paternal Grandmother    Heart disease Paternal Grandmother    Cancer Paternal Grandmother    Social History   Social History Narrative   Marital status/children/pets: Single   Education/employment: HS diploma   Safety:      -smoke alarm in the home:Yes     - wears seatbelt: Yes       Allergies as of 12/08/2020   No Known Allergies      Medication List    as of December 08, 2020  9:29 AM   You have not been prescribed any medications.    All past medical history, surgical history, allergies, family history, immunizations andmedications were updated in the EMR today and reviewed under the history and medication portions of their EMR.     No results found for this or any previous visit (from the past 2160 hour(s)).  No results found.   ROS: 14 pt review of systems performed and negative (unless mentioned in an  HPI)  Objective: BP 104/70   Pulse 83   Temp 98.4 F (36.9 C) (Oral)   Ht 5' 10.75" (1.797 m)   Wt 172 lb (78 kg)   SpO2 97%   BMI 24.16 kg/m  Gen: Afebrile. No acute distress. Nontoxic in appearance, well-developed, well-nourished,  pleasant male.  HENT: AT. [place]. Bilateral TM visualized and normal in appearance, normal external auditory canal. MMM, no oral lesions, adequate dentition. Bilateral nares within normal limits. Throat without erythema, ulcerations or exudates. no Cough on exam, no hoarseness on  exam. Eyes:Pupils Equal Round Reactive to light, Extraocular movements intact,  Conjunctiva without redness, discharge or icterus. Neck/lymp/endocrine: Supple,no lymphadenopathy, no thyromegaly CV: RRR no murmur, no edema, +2/4 P posterior tibialis pulses.  Chest: CTAB, no wheeze, rhonchi or crackles. normal Respiratory effort. good Air movement. Abd: Soft. flat. NTND. BS present. no Masses palpated. No hepatosplenomegaly. No rebound tenderness or guarding. Skin: light erythremic macular papular rash still present diffusely now over back.rashes, purpura or petechiae. Warm and well-perfused. Skin intact. Neuro/Msk: Normal gait. PERLA. EOMi. Alert. Oriented x3.  Cranial nerves II through XII intact. Muscle strength 5/5 upper/lower extremity. DTRs equal bilaterally. Psych: Normal affect, dress and demeanor. Normal speech. Normal thought content and judgment  No results found.  Assessment/plan: Zachary Morrow is a 25 y.o. male present for CPE Rash: Rash still present- appears faded compared to prior. He reports he does notice it is more raised after a shower- otherwise still asymptomatic.  Cmp was collected today Family history of heart disease/Lipid screening Diet and exercise - Lipid panel  Screen for STD (sexually transmitted disease) - SA male.  - Hepatitis C antibody - HIV antibody (with reflex) - Urine cytology ancillary only([place])  Diabetes mellitus screening - Comprehensive metabolic panel - Hemoglobin A1c Screening for deficiency anemia - CBC with Differential/Platelet Routine general medical examination at a health care facility Colonoscopy: No fhx- routine screen at 47 Immunizations:  tdap administered today, influenza encouraged yearly, HPV series completed, covid series completed> encouraged booster Infectious disease screening: HIV , Hep C . G/C > all collected today.  PSA: No results found for: PSA, pt was counseled on prostate cancer screenings. No fhx.   Patient was encouraged to exercise greater than 150 minutes a week. Patient was encouraged to choose a diet filled with fresh fruits and vegetables, and lean meats. AVS provided to patient today for education/recommendation on gender specific health and safety maintenance.  Return in about 1 year (around 12/08/2021) for CPE (30 min).   Orders Placed This Encounter  Procedures   Tdap vaccine greater than or equal to 7yo IM   CBC with Differential/Platelet   Comprehensive metabolic panel   Hemoglobin A1c   Lipid panel   Hepatitis C antibody   HIV antibody (with reflex)   No orders of the defined types were placed in this encounter.  Referral Orders  No referral(s) requested today     Note is dictated utilizing voice recognition software. Although note has been proof read prior to signing, occasional typographical errors still can be missed. If any questions arise, please do not hesitate to call for verification.  Electronically signed by: Felix Pacini, DO [facility] [place]

## 2020-12-09 LAB — HEPATITIS C ANTIBODY
Hepatitis C Ab: NONREACTIVE
SIGNAL TO CUT-OFF: 0 (ref <1.00)

## 2020-12-09 LAB — HIV ANTIBODY (ROUTINE TESTING W REFLEX): HIV 1&2 Ab, 4th Generation: NONREACTIVE

## 2020-12-31 ENCOUNTER — Ambulatory Visit: Payer: 59

## 2021-01-03 ENCOUNTER — Other Ambulatory Visit (HOSPITAL_COMMUNITY)
Admission: RE | Admit: 2021-01-03 | Discharge: 2021-01-03 | Disposition: A | Discharge status: Home / Self Care | Payer: 59 | Source: Ambulatory Visit | Attending: Family Medicine | Admitting: Family Medicine

## 2021-01-03 ENCOUNTER — Ambulatory Visit: Payer: 59

## 2021-01-03 ENCOUNTER — Other Ambulatory Visit: Payer: Self-pay

## 2021-01-03 DIAGNOSIS — Z113 Encounter for screening for infections with a predominantly sexual mode of transmission: Secondary | ICD-10-CM | POA: Insufficient documentation

## 2021-01-04 LAB — URINE CYTOLOGY ANCILLARY ONLY
Chlamydia: NEGATIVE
Comment: NEGATIVE
Comment: NORMAL
Neisseria Gonorrhea: NEGATIVE

## 2021-12-09 ENCOUNTER — Encounter: Payer: 59 | Admitting: Family Medicine

## 2021-12-20 ENCOUNTER — Encounter: Payer: 59 | Admitting: Family Medicine

## 2022-01-25 ENCOUNTER — Other Ambulatory Visit (HOSPITAL_COMMUNITY)
Admission: RE | Admit: 2022-01-25 | Discharge: 2022-01-25 | Disposition: A | Discharge status: Home / Self Care | Payer: 59 | Source: Ambulatory Visit | Attending: Family Medicine | Admitting: Family Medicine

## 2022-01-25 ENCOUNTER — Ambulatory Visit (INDEPENDENT_AMBULATORY_CARE_PROVIDER_SITE_OTHER): Payer: 59 | Admitting: Family Medicine

## 2022-01-25 ENCOUNTER — Encounter: Payer: Self-pay | Admitting: Family Medicine

## 2022-01-25 VITALS — BP 112/72 | HR 79 | Temp 98.3°F | Ht 72.24 in | Wt 176.6 lb

## 2022-01-25 DIAGNOSIS — Z8249 Family history of ischemic heart disease and other diseases of the circulatory system: Secondary | ICD-10-CM | POA: Diagnosis not present

## 2022-01-25 DIAGNOSIS — Z113 Encounter for screening for infections with a predominantly sexual mode of transmission: Secondary | ICD-10-CM

## 2022-01-25 DIAGNOSIS — Z23 Encounter for immunization: Secondary | ICD-10-CM | POA: Diagnosis not present

## 2022-01-25 DIAGNOSIS — R5383 Other fatigue: Secondary | ICD-10-CM

## 2022-01-25 DIAGNOSIS — Z0000 Encounter for general adult medical examination without abnormal findings: Secondary | ICD-10-CM

## 2022-01-25 LAB — BASIC METABOLIC PANEL
BUN: 10 mg/dL (ref 6–23)
CO2: 27 mEq/L (ref 19–32)
Calcium: 9.5 mg/dL (ref 8.4–10.5)
Chloride: 100 mEq/L (ref 96–112)
Creatinine, Ser: 0.93 mg/dL (ref 0.40–1.50)
GFR: 114.9 mL/min (ref >60.00)
Glucose, Bld: 75 mg/dL (ref 70–99)
Potassium: 4.3 mEq/L (ref 3.5–5.1)
Sodium: 138 mEq/L (ref 135–145)

## 2022-01-25 LAB — LIPID PANEL
Cholesterol: 171 mg/dL (ref 0–200)
HDL: 50.9 mg/dL (ref >39.00)
LDL Cholesterol: 99 mg/dL (ref 0–99)
NonHDL: 119.68
Total CHOL/HDL Ratio: 3
Triglycerides: 104 mg/dL (ref 0.0–149.0)
VLDL: 20.8 mg/dL (ref 0.0–40.0)

## 2022-01-25 NOTE — Patient Instructions (Signed)
Return in about 1 year (around 01/27/2023) for cpe (20 min).        Great to see you today.  I have refilled the medication(s) we provide.   If labs were collected, we will inform you of lab results once received either by echart message or telephone call.   - echart message- for normal results that have been seen by the patient already.   - telephone call: abnormal results or if patient has not viewed results in their echart.  Health Maintenance, Male Adopting a healthy lifestyle and getting preventive care are important in promoting health and wellness. Ask your health care provider about: The right schedule for you to have regular tests and exams. Things you can do on your own to prevent diseases and keep yourself healthy. What should I know about diet, weight, and exercise? Eat a healthy diet  Eat a diet that includes plenty of vegetables, fruits, low-fat dairy products, and lean protein. Do not eat a lot of foods that are high in solid fats, added sugars, or sodium. Maintain a healthy weight Body mass index (BMI) is a measurement that can be used to identify possible weight problems. It estimates body fat based on height and weight. Your health care provider can help determine your BMI and help you achieve or maintain a healthy weight. Get regular exercise Get regular exercise. This is one of the most important things you can do for your health. Most adults should: Exercise for at least 150 minutes each week. The exercise should increase your heart rate and make you sweat (moderate-intensity exercise). Do strengthening exercises at least twice a week. This is in addition to the moderate-intensity exercise. Spend less time sitting. Even light physical activity can be beneficial. Watch cholesterol and blood lipids Have your blood tested for lipids and cholesterol at 24 years of age, then have this test every 5 years. You may need to have your cholesterol levels checked more often  if: Your lipid or cholesterol levels are high. You are older than 24 years of age. You are at high risk for heart disease. What should I know about cancer screening? Many types of cancers can be detected early and may often be prevented. Depending on your health history and family history, you may need to have cancer screening at various ages. This may include screening for: Colorectal cancer. Prostate cancer. Skin cancer. Lung cancer. What should I know about heart disease, diabetes, and high blood pressure? Blood pressure and heart disease High blood pressure causes heart disease and increases the risk of stroke. This is more likely to develop in people who have high blood pressure readings or are overweight. Talk with your health care provider about your target blood pressure readings. Have your blood pressure checked: Every 3-5 years if you are 59-61 years of age. Every year if you are 19 years old or older. If you are between the ages of 55 and 50 and are a current or former smoker, ask your health care provider if you should have a one-time screening for abdominal aortic aneurysm (AAA). Diabetes Have regular diabetes screenings. This checks your fasting blood sugar level. Have the screening done: Once every three years after age 73 if you are at a normal weight and have a low risk for diabetes. More often and at a younger age if you are overweight or have a high risk for diabetes. What should I know about preventing infection? Hepatitis B If you have a higher risk for  hepatitis B, you should be screened for this virus. Talk with your health care provider to find out if you are at risk for hepatitis B infection. Hepatitis C Blood testing is recommended for: Everyone born from [birth year] through 1965. Anyone with known risk factors for hepatitis C. Sexually transmitted infections (STIs) You should be screened each year for STIs, including gonorrhea and chlamydia, if: You are sexually  active and are younger than 24 years of age. You are older than 24 years of age and your health care provider tells you that you are at risk for this type of infection. Your sexual activity has changed since you were last screened, and you are at increased risk for chlamydia or gonorrhea. Ask your health care provider if you are at risk. Ask your health care provider about whether you are at high risk for HIV. Your health care provider may recommend a prescription medicine to help prevent HIV infection. If you choose to take medicine to prevent HIV, you should first get tested for HIV. You should then be tested every 3 months for as long as you are taking the medicine. Follow these instructions at home: Alcohol use Do not drink alcohol if your health care provider tells you not to drink. If you drink alcohol: Limit how much you have to 0-2 drinks a day. Know how much alcohol is in your drink. In the [place] one drink equals one 12 oz bottle of beer (355 mL), one 5 oz glass of wine (148 mL), or one 1 oz glass of hard liquor (44 mL). Lifestyle Do not use any products that contain nicotine or tobacco. These products include cigarettes, chewing tobacco, and vaping devices, such as e-cigarettes. If you need help quitting, ask your health care provider. Do not use street drugs. Do not share needles. Ask your health care provider for help if you need support or information about quitting drugs. General instructions Schedule regular health, dental, and eye exams. Stay current with your vaccines. Tell your health care provider if: You often feel depressed. You have ever been abused or do not feel safe at home. Summary Adopting a healthy lifestyle and getting preventive care are important in promoting health and wellness. Follow your health care provider's instructions about healthy diet, exercising, and getting tested or screened for diseases. Follow your health care provider's instructions on  monitoring your cholesterol and blood pressure. This information is not intended to replace advice given to you by your health care provider. Make sure you discuss any questions you have with your health care provider. Document Revised: 11/01/2020 Document Reviewed: 11/01/2020 Elsevier Patient Education  [place].

## 2022-01-25 NOTE — Progress Notes (Signed)
Patient ID: Zachary Morrow, male  DOB: 08-29-1995, 26 y.o.   MRN: [id number] Patient Care Team    Relationship Specialty Notifications Start End  Natalia Leatherwood, DO PCP - General Family Medicine  02/27/18     Chief Complaint  Patient presents with   Annual Exam    Pt is fasting    Subjective: Zachary Morrow is a 26 y.o. male present for CPE. All past medical history, surgical history, allergies, family history, immunizations, medications and social history were updated in the electronic medical record today. All recent labs, ED visits and hospitalizations within the last year were reviewed.  Health maintenance:  Colonoscopy: No fhx- routine screen at 47 Immunizations:  tdap UTD 2022, influenza encouraged yearly, HPV series completed-today, covid seriesx2 Infectious disease screening: HIV collected today , Hep C -completed. G/C > collected today PSA: No results found for: "PSA", pt was counseled on prostate cancer screenings. Routine screen Assistive device: none Oxygen DEY:CXKG Patient has a Dental home. Hospitalizations/ED visits: reviewed     01/25/2022    8:53 AM 12/08/2020    8:03 AM 09/29/2020    3:02 PM 02/27/2018    1:32 PM  Depression screen PHQ 2/9  Decreased Interest 0 0 0 0  Down, Depressed, Hopeless 0 0 0 0  PHQ - 2 Score 0 0 0 0  Altered sleeping 0     Tired, decreased energy 1     Change in appetite 0     Feeling bad or failure about yourself  0     Trouble concentrating 0     Moving slowly or fidgety/restless 0     Suicidal thoughts 0     PHQ-9 Score 1         01/25/2022    8:53 AM  GAD 7 : Generalized Anxiety Score  Nervous, Anxious, on Edge 0  Control/stop worrying 0  Worry too much - different things 0  Trouble relaxing 0  Restless 0  Easily annoyed or irritable 0  Afraid - awful might happen 0  Total GAD 7 Score 0           No data to display           Immunization History  Administered Date(s) Administered   HPV 9-valent  11/01/2012, 02/12/2014, 01/25/2022   PFIZER(Purple Top)SARS-COV-2 Vaccination 10/02/2019, 10/27/2019   Tdap 12/08/2020    Past Medical History:  Diagnosis Date   Blood in stool 2016   history   No Known Allergies Past Surgical History:  Procedure Laterality Date   NO PAST SURGERIES     Family History  Problem Relation Age of Onset   Breast cancer Mother    Miscarriages / Stillbirths Mother    Heart disease Father    Hyperlipidemia Father    Heart disease Maternal Grandmother    Hyperlipidemia Maternal Grandmother    Hypertension Maternal Grandmother    Stroke Maternal Grandmother    Heart disease Maternal Grandfather    Hyperlipidemia Maternal Grandfather    Hypertension Maternal Grandfather    Diabetes Maternal Grandfather    Hyperlipidemia Paternal Grandmother    Hypertension Paternal Grandmother    Hearing loss Paternal Grandmother    Heart disease Paternal Grandmother    Cancer Paternal Grandmother    Social History   Social History Narrative   Marital status/children/pets: Single   Education/employment: HS diploma   Safety:      -smoke alarm in the home:Yes     -  wears seatbelt: Yes       Allergies as of 01/25/2022   No Known Allergies      Medication List        Accurate as of January 25, 2022  9:11 AM. If you have any questions, ask your nurse or doctor.          ketoconazole 2 % shampoo Commonly known as: NIZORAL Apply topically 2 (two) times a week.       All past medical history, surgical history, allergies, family history, immunizations andmedications were updated in the EMR today and reviewed under the history and medication portions of their EMR.     No results found for this or any previous visit (from the past 2160 hour(s)).  No results found.  ROS 14 pt review of systems performed and negative (unless mentioned in an HPI)  Objective: BP 112/72   Pulse 79   Temp 98.3 F (36.8 C)   Ht 6' 0.24" (1.835 m)   Wt 176 lb 9.6 oz  (80.1 kg)   SpO2 97%   BMI 23.79 kg/m  Physical Exam Constitutional:      General: He is not in acute distress.    Appearance: Normal appearance. He is not ill-appearing, toxic-appearing or diaphoretic.  HENT:     Head: Normocephalic and atraumatic.     Right Ear: Tympanic membrane, ear canal and external ear normal. There is no impacted cerumen.     Left Ear: Tympanic membrane, ear canal and external ear normal. There is no impacted cerumen.     Nose: Nose normal. No congestion or rhinorrhea.     Mouth/Throat:     Mouth: Mucous membranes are moist.     Pharynx: Oropharynx is clear. No oropharyngeal exudate or posterior oropharyngeal erythema.  Eyes:     General: No scleral icterus.       Right eye: No discharge.        Left eye: No discharge.     Extraocular Movements: Extraocular movements intact.     Pupils: Pupils are equal, round, and reactive to light.  Cardiovascular:     Rate and Rhythm: Normal rate and regular rhythm.     Pulses: Normal pulses.     Heart sounds: Normal heart sounds. No murmur heard.    No friction rub. No gallop.  Pulmonary:     Effort: Pulmonary effort is normal. No respiratory distress.     Breath sounds: Normal breath sounds. No stridor. No wheezing, rhonchi or rales.  Chest:     Chest wall: No tenderness.  Abdominal:     General: Abdomen is flat. Bowel sounds are normal. There is no distension.     Palpations: Abdomen is soft. There is no mass.     Tenderness: There is no abdominal tenderness. There is no right CVA tenderness, left CVA tenderness, guarding or rebound.     Hernia: No hernia is present.  Musculoskeletal:        General: No swelling or tenderness. Normal range of motion.     Cervical back: Normal range of motion and neck supple.     Right lower leg: No edema.     Left lower leg: No edema.  Lymphadenopathy:     Cervical: No cervical adenopathy.  Skin:    General: Skin is warm and dry.     Coloration: Skin is not jaundiced.      Findings: No bruising, lesion or rash.  Neurological:     General: No focal deficit present.  Mental Status: He is alert and oriented to person, place, and time. Mental status is at baseline.     Cranial Nerves: No cranial nerve deficit.     Sensory: No sensory deficit.     Motor: No weakness.     Coordination: Coordination normal.     Gait: Gait normal.     Deep Tendon Reflexes: Reflexes normal.  Psychiatric:        Mood and Affect: Mood normal.        Behavior: Behavior normal.        Thought Content: Thought content normal.        Judgment: Judgment normal.     No results found.  Assessment/plan: Zachary Morrow is a 26 y.o. male present for CPE Family history of heart disease - Basic Metabolic Panel (BMET) - Lipid panel Screen for STD (sexually transmitted disease) Desired by pt.  - HIV antibody (with reflex) - RPR - HSV 2 antibody, IgG - Urine cytology ancillary only([place]) Need for HPV vaccination - HPV 9-valent vaccine,Recombinat Routine general medical examination at a health care facility Colonoscopy: No fhx- routine screen at 47 Immunizations:  tdap UTD 2022, influenza encouraged yearly, HPV series completed-today, covid seriesx2 Infectious disease screening: HIV collected today , Hep C -completed. G/C > collected today Patient was encouraged to exercise greater than 150 minutes a week. Patient was encouraged to choose a diet filled with fresh fruits and vegetables, and lean meats. AVS provided to patient today for education/recommendation on gender specific health and safety maintenance.  Return in about 1 year (around 01/27/2023) for cpe (20 min).   Orders Placed This Encounter  Procedures   HPV 9-valent vaccine,Recombinat   Basic Metabolic Panel (BMET)   Lipid panel   HIV antibody (with reflex)   RPR   HSV 2 antibody, IgG   No orders of the defined types were placed in this encounter.  Referral Orders  No referral(s) requested today      Note is dictated utilizing voice recognition software. Although note has been proof read prior to signing, occasional typographical errors still can be missed. If any questions arise, please do not hesitate to call for verification.  Electronically signed by: Felix Pacini, DO [facility] [employer]

## 2022-01-26 ENCOUNTER — Telehealth: Payer: Self-pay

## 2022-01-26 LAB — RPR: RPR Ser Ql: NONREACTIVE

## 2022-01-26 LAB — HSV 2 ANTIBODY, IGG: HSV 2 Glycoprotein G Ab, IgG: <0.9 index

## 2022-01-26 LAB — URINE CYTOLOGY ANCILLARY ONLY
Chlamydia: NEGATIVE
Comment: NEGATIVE
Comment: NORMAL
Neisseria Gonorrhea: NEGATIVE

## 2022-01-26 LAB — HIV ANTIBODY (ROUTINE TESTING W REFLEX): HIV 1&2 Ab, 4th Generation: NONREACTIVE

## 2022-01-26 NOTE — Telephone Encounter (Signed)
Patient returning call regarding results.  Please call patient when available.

## 2022-01-26 NOTE — Telephone Encounter (Signed)
Spoke with pt regarding labs and instructions.   

## 2023-01-29 ENCOUNTER — Encounter: Payer: 59 | Admitting: Family Medicine

## 2023-08-24 ENCOUNTER — Ambulatory Visit: Payer: Self-pay | Admitting: Family Medicine

## 2023-08-24 NOTE — Telephone Encounter (Signed)
 Chief Complaint: Urinary symptoms  Symptoms: Slightly burning with urination, increased frequency, bladder not feeling empty after urination, bilateral flank pain, penile discharge Frequency: comes and goes 2-3 months  Pertinent Negatives: Patient denies fever, nausea, vomiting, blood in urine Disposition: [] ED /[] Urgent Care (no appt availability in office) / [x] Appointment(In office/virtual)/ []  [facility]/ [] Home Care/ [] Refused Recommended Disposition /[] [facility]/ []  Follow-up with PCP Additional Notes: Patient states he has been in a committed relationship for 1 year. He reports that about 2-3 months ago he started having some discomfort when urinating and not feeling like his bladder was empty after urinating. Patient states he notice some discharge from his penis for the first time yesterday and it scared him. Patient also stated he has bilateral flank pain. Care advice was given and patient has been scheduled for the first available appt with PCP per patient's request.  Advised if symptoms get worse over the weekend to seek care an urgent care. Patient verbalized understanding.  Copied from CRM [id number]. Topic: Clinical - Red Word Triage >> Aug 24, 2023  4:06 PM Denese Killings wrote: Red Word that prompted transfer to Nurse Triage: Patient is having trouble urinating. Symptoms-  feels like he isn't done, slight discomfort, burning sensation. Reason for Disposition  Side (flank) or lower back pain present  Answer Assessment - Initial Assessment Questions 1. SYMPTOM: "What's the main symptom you're concerned about?" (e.g., frequency, incontinence)     Increased frequency  2. ONSET: "When did the  frequency  start?"     2-3 months  3. PAIN: "Is there any pain?" If Yes, ask: "How bad is it?" (Scale: 1-10; mild, moderate, severe)     Mild  4. CAUSE: "What do you think is causing the symptoms?"     Unsure  5. OTHER SYMPTOMS: "Do you have any other symptoms?" (e.g.,  blood in urine, fever, flank pain, pain with urination)     Burning, bilateral flank pain, feel like he is not emptying his bladder  Protocols used: Urinary Symptoms-A-AH

## 2023-08-27 NOTE — Telephone Encounter (Signed)
 FYI noted, appt 3/4

## 2023-08-28 ENCOUNTER — Ambulatory Visit: Payer: 59 | Admitting: Family Medicine

## 2023-08-28 ENCOUNTER — Other Ambulatory Visit (HOSPITAL_COMMUNITY)
Admission: RE | Admit: 2023-08-28 | Discharge: 2023-08-28 | Disposition: A | Discharge status: Home / Self Care | Source: Ambulatory Visit | Attending: Family Medicine | Admitting: Family Medicine

## 2023-08-28 ENCOUNTER — Encounter: Payer: Self-pay | Admitting: Family Medicine

## 2023-08-28 VITALS — BP 130/80 | HR 94 | Temp 99.4°F | Ht 72.24 in | Wt 196.6 lb

## 2023-08-28 DIAGNOSIS — R300 Dysuria: Secondary | ICD-10-CM | POA: Diagnosis not present

## 2023-08-28 DIAGNOSIS — R112 Nausea with vomiting, unspecified: Secondary | ICD-10-CM | POA: Diagnosis not present

## 2023-08-28 DIAGNOSIS — R1010 Upper abdominal pain, unspecified: Secondary | ICD-10-CM

## 2023-08-28 LAB — CBC WITH DIFFERENTIAL/PLATELET
Basophils Absolute: 0.1 10*3/uL (ref 0.0–0.1)
Basophils Relative: 1.3 % (ref 0.0–3.0)
Eosinophils Absolute: 0.1 10*3/uL (ref 0.0–0.7)
Eosinophils Relative: 1.1 % (ref 0.0–5.0)
HCT: 47.8 % (ref 39.0–52.0)
Hemoglobin: 16.4 g/dL (ref 13.0–17.0)
Lymphocytes Relative: 40.1 % (ref 12.0–46.0)
Lymphs Abs: 2.1 10*3/uL (ref 0.7–4.0)
MCHC: 34.3 g/dL (ref 30.0–36.0)
MCV: 90.1 fl (ref 78.0–100.0)
Monocytes Absolute: 0.4 10*3/uL (ref 0.1–1.0)
Monocytes Relative: 7.8 % (ref 3.0–12.0)
Neutro Abs: 2.6 10*3/uL (ref 1.4–7.7)
Neutrophils Relative %: 49.7 % (ref 43.0–77.0)
Platelets: 328 10*3/uL (ref 150.0–400.0)
RBC: 5.3 Mil/uL (ref 4.22–5.81)
RDW: 12.9 % (ref 11.5–15.5)
WBC: 5.2 10*3/uL (ref 4.0–10.5)

## 2023-08-28 LAB — POC URINALSYSI DIPSTICK (AUTOMATED)
Blood, UA: NEGATIVE
Glucose, UA: NEGATIVE
Ketones, UA: NEGATIVE
Leukocytes, UA: NEGATIVE
Nitrite, UA: NEGATIVE
Protein, UA: NEGATIVE
Spec Grav, UA: >=1.03 — AB (ref 1.010–1.025)
Urobilinogen, UA: 0.2 U/dL
pH, UA: 6 (ref 5.0–8.0)

## 2023-08-28 LAB — COMPREHENSIVE METABOLIC PANEL
ALT: 21 U/L (ref 0–53)
AST: 17 U/L (ref 0–37)
Albumin: 5.1 g/dL (ref 3.5–5.2)
Alkaline Phosphatase: 47 U/L (ref 39–117)
BUN: 8 mg/dL (ref 6–23)
CO2: 32 meq/L (ref 19–32)
Calcium: 10.1 mg/dL (ref 8.4–10.5)
Chloride: 103 meq/L (ref 96–112)
Creatinine, Ser: 0.82 mg/dL (ref 0.40–1.50)
GFR: 121.55 mL/min (ref >60.00)
Glucose, Bld: 98 mg/dL (ref 70–99)
Potassium: 4.4 meq/L (ref 3.5–5.1)
Sodium: 142 meq/L (ref 135–145)
Total Bilirubin: 0.5 mg/dL (ref 0.2–1.2)
Total Protein: 7.2 g/dL (ref 6.0–8.3)

## 2023-08-28 LAB — LIPASE: Lipase: 21 U/L (ref 11.0–59.0)

## 2023-08-28 LAB — C-REACTIVE PROTEIN: CRP: <1 mg/dL (ref 0.5–20.0)

## 2023-08-28 MED ORDER — ONDANSETRON 4 MG PO TBDP: 4.0000 mg | ORAL_TABLET | Freq: Three times a day (TID) | ORAL | 0 refills | Status: AC | PRN

## 2023-08-28 MED ORDER — PANTOPRAZOLE SODIUM 40 MG PO TBEC: 40.0000 mg | DELAYED_RELEASE_TABLET | Freq: Every day | ORAL | 3 refills | Status: AC

## 2023-08-28 NOTE — Patient Instructions (Addendum)
 Return in about 2 weeks (around 09/11/2023), or if symptoms worsen or fail to improve.        Great to see you today.  I have refilled the medication(s) we provide.   If labs were collected or images ordered, we will inform you of  results once we have received them and reviewed. We will contact you either by echart message, or telephone call.  Please give ample time to the testing facility, and our office to run,  receive and review results. Please do not call inquiring of results, even if you can see them in your chart. We will contact you as soon as we are able. If it has been over 1 week since the test was completed, and you have not yet heard from [place], then please call us.    - echart message- for normal results that have been seen by the patient already.   - telephone call: abnormal results or if patient has not viewed results in their echart.  If a referral to a specialist was entered for you, please call us in 2 weeks if you have not heard from the specialist office to schedule.

## 2023-08-28 NOTE — Progress Notes (Signed)
 JOSEH SJOGREN , 07-07-1995, 28 y.o., male MRN: [id number] Patient Care Team    Relationship Specialty Notifications Start End  Natalia Leatherwood, DO PCP - General Family Medicine  02/27/18     Chief Complaint  Patient presents with   Dysuria    Pt states abd discomfort after taking AZO last week; nausea, bloating and lower back pain     Subjective: TREVEL DILLENBECK is a 28 y.o. Pt presents for an OV with complaints of dysuria, urinary frequency and epigastric discomfort.  Patient reports over the last week or so he has felt discomfort points to his epigastric area and feels bloated or full feeling, even when he has not eaten yet.  He states the whole feeling remains even though he is hungry.  He has also noticed burning with urination, and increased frequency of urination.  He states he feels like he was not emptying his bladder completely and strained in which she noted a white thicker material within his urine, no blood.  He does not have a history of kidney stones, but his father is currently suffering from a kidney stone. Patient reports he increased his hydration and been drinking a great deal of water and cranberry juice to help with the symptoms.  He denies any penile lesions or drainage.  He denies any scrotal pain.  He is in a monogamous relationship.  He does note more stress recently. He states he used to drink alcohol more heavily, but since the summertime he has decreased his alcohol consumption.  He does not routinely take NSAIDs or Goody powders.  He does have a coping mechanism for his stress levels, that helps with his GI symptoms, that he recently discontinued.  He states his GI symptoms came on almost immediately.  Patient reports he does consume spicy food most days. He denies fevers, chills, diarrhea, melena.     08/28/2023    9:14 AM 01/25/2022    8:53 AM 12/08/2020    8:03 AM 09/29/2020    3:02 PM 02/27/2018    1:32 PM  Depression screen PHQ 2/9  Decreased Interest 0 0 0  0 0  Down, Depressed, Hopeless 0 0 0 0 0  PHQ - 2 Score 0 0 0 0 0  Altered sleeping  0     Tired, decreased energy  1     Change in appetite  0     Feeling bad or failure about yourself   0     Trouble concentrating  0     Moving slowly or fidgety/restless  0     Suicidal thoughts  0     PHQ-9 Score  1       No Known Allergies Social History   Social History Narrative   Marital status/children/pets: Single   Education/employment: HS diploma   Safety:      -smoke alarm in the home:Yes     - wears seatbelt: Yes      Past Medical History:  Diagnosis Date   Blood in stool 2016   history   Past Surgical History:  Procedure Laterality Date   NO PAST SURGERIES     Family History  Problem Relation Age of Onset   Breast cancer Mother    Miscarriages / Stillbirths Mother    Heart disease Father    Hyperlipidemia Father    Heart disease Maternal Grandmother    Hyperlipidemia Maternal Grandmother    Hypertension Maternal Grandmother  Stroke Maternal Grandmother    Heart disease Maternal Grandfather    Hyperlipidemia Maternal Grandfather    Hypertension Maternal Grandfather    Diabetes Maternal Grandfather    Hyperlipidemia Paternal Grandmother    Hypertension Paternal Grandmother    Hearing loss Paternal Grandmother    Heart disease Paternal Grandmother    Cancer Paternal Grandmother    Allergies as of 08/28/2023   No Known Allergies      Medication List        Accurate as of August 28, 2023 12:12 PM. If you have any questions, ask your nurse or doctor.          ketoconazole 2 % shampoo Commonly known as: NIZORAL Apply topically 2 (two) times a week.   ondansetron 4 MG disintegrating tablet Commonly known as: ZOFRAN-ODT Take 1 tablet (4 mg total) by mouth every 8 (eight) hours as needed for nausea or vomiting. Started by: Felix Pacini   pantoprazole 40 MG tablet Commonly known as: PROTONIX Take 1 tablet (40 mg total) by mouth daily. Started by: Felix Pacini        All past medical history, surgical history, allergies, family history, immunizations andmedications were updated in the EMR today and reviewed under the history and medication portions of their EMR.     ROS Negative, with the exception of above mentioned in HPI   Objective:  BP 130/80   Pulse 94   Temp 99.4 F (37.4 C) (Oral)   Ht 6' 0.24" (1.835 m)   Wt 196 lb 9.6 oz (89.2 kg)   SpO2 96%   BMI 26.49 kg/m  Body mass index is 26.49 kg/m. Physical Exam Vitals and nursing note reviewed.  Constitutional:      General: He is not in acute distress.    Appearance: Normal appearance. He is not ill-appearing, toxic-appearing or diaphoretic.  HENT:     Head: Normocephalic and atraumatic.  Eyes:     General: No scleral icterus.       Right eye: No discharge.        Left eye: No discharge.     Extraocular Movements: Extraocular movements intact.     Pupils: Pupils are equal, round, and reactive to light.  Cardiovascular:     Rate and Rhythm: Normal rate and regular rhythm.  Pulmonary:     Effort: Pulmonary effort is normal. No respiratory distress.     Breath sounds: Normal breath sounds. No wheezing, rhonchi or rales.  Abdominal:     General: Abdomen is flat. Bowel sounds are normal. There is no distension.     Palpations: Abdomen is soft. There is no mass.     Tenderness: There is abdominal tenderness in the epigastric area. There is no right CVA tenderness, left CVA tenderness, guarding or rebound. Negative signs include Murphy's sign and McBurney's sign.     Hernia: No hernia is present.       Comments: Moderate TTP (red x) Mild Discomfort TTP (pink)- > stool burdern  Musculoskeletal:     Right lower leg: No edema.     Left lower leg: No edema.  Skin:    General: Skin is warm.     Findings: No rash.  Neurological:     Mental Status: He is alert and oriented to person, place, and time. Mental status is at baseline.  Psychiatric:        Mood and Affect:  Mood normal.        Behavior: Behavior normal.  Thought Content: Thought content normal.        Judgment: Judgment normal.      No results found. No results found. Results for orders placed or performed in visit on 08/28/23 (from the past 24 hours)  POCT Urinalysis Dipstick (Automated)     Status: Abnormal   Collection Time: 08/28/23  9:22 AM  Result Value Ref Range   Color, UA dark yellow    Clarity, UA clear    Glucose, UA Negative Negative   Bilirubin, UA 1+    Ketones, UA Negative    Spec Grav, UA >=1.030 (A) 1.010 - 1.025   Blood, UA Negative    pH, UA 6.0 5.0 - 8.0   Protein, UA Negative Negative   Urobilinogen, UA 0.2 0.2 or 1.0 E.U./dL   Nitrite, UA Negative    Leukocytes, UA Negative Negative    Assessment/Plan: Nikkolas I Klosinski is a 28 y.o. male present for OV for  Dysuria Point-of-care urinalysis does not appear infectious today. No blood to suggest kidney stone and symptoms do not correlate with kidney stone at this time.  Will wait on urine culture to see if infection is present and treat appropriately at that time. - POCT Urinalysis Dipstick (Automated) - Urine cytology ancillary only([place]) - Urinalysis w microscopic + reflex cultur - CBC w/Diff - Comp Met (CMET) Some of his needing to strain with urination may be related to mild constipation palpated on exam vs mucous discharge/drainage or yeast.  Awaiting urine results  Pain of upper abdomen (Primary)/nausea - CBC w/Diff/CMP, lipase, CRP collected today Zofran prescribed for nausea -Start Protonix daily, advised patient he could take first thing in the morning about an hour before any meal or he could elect to take before bed. -He was tender on exam over stomach and duodenal bulb area. -Believe part of his symptoms is secondary to gastritis/reflux symptoms.  Starting Protonix should help ease symptoms while we await her results. Patient understands if symptoms are worsening he will need to  return for further evaluation and consider CT imaging at that time. Encouraged him to avoid alcohol, NSAIDs and spicy foods for now.  Reviewed expectations re: course of current medical issues. Discussed self-management of symptoms. Outlined signs and symptoms indicating need for more acute intervention. Patient verbalized understanding and all questions were answered. Patient received an After-Visit Summary.    Orders Placed This Encounter  Procedures   Urinalysis w microscopic + reflex cultur   CBC w/Diff   Comp Met (CMET)   Lipase   C-reactive protein   POCT Urinalysis Dipstick (Automated)   Meds ordered this encounter  Medications   ondansetron (ZOFRAN-ODT) 4 MG disintegrating tablet    Sig: Take 1 tablet (4 mg total) by mouth every 8 (eight) hours as needed for nausea or vomiting.    Dispense:  20 tablet    Refill:  0   pantoprazole (PROTONIX) 40 MG tablet    Sig: Take 1 tablet (40 mg total) by mouth daily.    Dispense:  30 tablet    Refill:  3   Referral Orders  No referral(s) requested today     Note is dictated utilizing voice recognition software. Although note has been proof read prior to signing, occasional typographical errors still can be missed. If any questions arise, please do not hesitate to call for verification.   electronically signed by:  Felix Pacini, DO  [facility] [place]

## 2023-08-29 LAB — URINE CYTOLOGY ANCILLARY ONLY
Chlamydia: NEGATIVE
Comment: NEGATIVE
Comment: NEGATIVE
Comment: NORMAL
Neisseria Gonorrhea: NEGATIVE
Trichomonas: NEGATIVE

## 2023-08-30 ENCOUNTER — Telehealth: Payer: Self-pay | Admitting: Family Medicine

## 2023-08-30 LAB — URINALYSIS W MICROSCOPIC + REFLEX CULTURE
Bacteria, UA: NONE SEEN /HPF
Bilirubin Urine: NEGATIVE
Glucose, UA: NEGATIVE
Hgb urine dipstick: NEGATIVE
Hyaline Cast: NONE SEEN /LPF
Nitrites, Initial: NEGATIVE
Specific Gravity, Urine: 1.03 (ref 1.001–1.035)
Squamous Epithelial / HPF: NONE SEEN /HPF (ref <=5)
WBC, UA: NONE SEEN /HPF (ref 0–5)
pH: 5.5 (ref 5.0–8.0)

## 2023-08-30 LAB — CULTURE INDICATED

## 2023-08-30 LAB — URINE CULTURE
MICRO NUMBER:: 16161711
SPECIMEN QUALITY:: ADEQUATE

## 2023-08-30 NOTE — Telephone Encounter (Signed)
 Pt given results

## 2023-08-30 NOTE — Telephone Encounter (Signed)
 Please inform patient his urine cultures and cytologies are all normal.
